# Patient Record
Sex: Male | Born: 1990 | Hispanic: No | Marital: Single | Smoking: Never smoker
Health system: Southern US, Community
[De-identification: ages and names within clinical notes are randomized; demographics above are authoritative.]

## PROBLEM LIST (undated history)

## (undated) HISTORY — PX: ANKLE SURGERY: SHX546

## (undated) HISTORY — PX: TONSILLECTOMY: SUR1361

---

## 2015-07-13 ENCOUNTER — Emergency Department
Admission: EM | Admit: 2015-07-13 | Discharge: 2015-07-13 | Disposition: A | Discharge status: Home / Self Care | Payer: Self-pay | Attending: Emergency Medicine | Admitting: Emergency Medicine

## 2015-07-13 ENCOUNTER — Encounter: Payer: Self-pay | Admitting: Emergency Medicine

## 2015-07-13 ENCOUNTER — Emergency Department: Payer: Self-pay

## 2015-07-13 DIAGNOSIS — R079 Chest pain, unspecified: Secondary | ICD-10-CM | POA: Insufficient documentation

## 2015-07-13 DIAGNOSIS — F419 Anxiety disorder, unspecified: Secondary | ICD-10-CM | POA: Insufficient documentation

## 2015-07-13 DIAGNOSIS — I10 Essential (primary) hypertension: Secondary | ICD-10-CM | POA: Insufficient documentation

## 2015-07-13 LAB — CBC
HCT: 43.1 % (ref 40.0–52.0)
HEMOGLOBIN: 14.7 g/dL (ref 13.0–18.0)
MCH: 26.9 pg (ref 26.0–34.0)
MCHC: 34.2 g/dL (ref 32.0–36.0)
MCV: 78.7 fL — AB (ref 80.0–100.0)
PLATELETS: 133 10*3/uL — AB (ref 150–440)
RBC: 5.47 MIL/uL (ref 4.40–5.90)
RDW: 12.3 % (ref 11.5–14.5)
WBC: 6.2 10*3/uL (ref 3.8–10.6)

## 2015-07-13 LAB — COMPREHENSIVE METABOLIC PANEL
ALK PHOS: 51 U/L (ref 38–126)
ALT: 15 U/L — AB (ref 17–63)
AST: 17 U/L (ref 15–41)
Albumin: 4.7 g/dL (ref 3.5–5.0)
Anion gap: 7 (ref 5–15)
BUN: 14 mg/dL (ref 6–20)
CALCIUM: 9.3 mg/dL (ref 8.9–10.3)
CHLORIDE: 104 mmol/L (ref 101–111)
CO2: 29 mmol/L (ref 22–32)
CREATININE: 1.05 mg/dL (ref 0.61–1.24)
GFR calc non Af Amer: >60 mL/min (ref >60)
GLUCOSE: 106 mg/dL — AB (ref 65–99)
Potassium: 4 mmol/L (ref 3.5–5.1)
SODIUM: 140 mmol/L (ref 135–145)
Total Bilirubin: 0.7 mg/dL (ref 0.3–1.2)
Total Protein: 7.3 g/dL (ref 6.5–8.1)

## 2015-07-13 LAB — TROPONIN I: Troponin I: <0.03 ng/mL (ref <0.031)

## 2015-07-13 NOTE — Discharge Instructions (Signed)
Hypertension °Hypertension, commonly called [place] blood pressure, is when the force of blood pumping through your arteries is too strong. Your arteries are the blood vessels that carry blood from your heart throughout your body. A blood pressure reading consists of a higher number over a lower number, such as 110/72. The higher number (systolic) is the pressure inside your arteries when your heart pumps. The lower number (diastolic) is the pressure inside your arteries when your heart relaxes. Ideally you want your blood pressure below 120/80. °Hypertension forces your heart to work harder to pump blood. Your arteries may become narrow or stiff. Having untreated or uncontrolled hypertension can cause heart attack, stroke, kidney disease, and other problems. °RISK FACTORS °Some risk factors for W[place] blood pressure are controllable. Others are not.  °Risk factors you cannot control include:  °· Race. You may be at higher risk if you are African American. °· Age. Risk increases with age. °· Gender. Men are at higher risk than women before age 45 years. After age 65, women are at higher risk than men. °Risk factors you can control include: °· Not getting enough exercise or physical activity. °· Being overweight. °· Getting too much fat, sugar, calories, or salt in your diet. °· Drinking too much alcohol. °SIGNS AND SYMPTOMS °Hypertension does not usually cause signs or symptoms. Extremely K[place] blood pressure (hypertensive crisis) may cause headache, anxiety, shortness of breath, and nosebleed. °DIAGNOSIS °To check if you have hypertension, your health care provider will measure your blood pressure while you are seated, with your arm held at the level of your heart. It should be measured at least twice using the same arm. Certain conditions can cause a difference in blood pressure between your right and left arms. A blood pressure reading that is higher than normal on one occasion does not mean that you need treatment. If  it is not clear whether you have Wei[place] blood pressure, you may be asked to return on a different day to have your blood pressure checked again. Or, you may be asked to monitor your blood pressure at home for 1 or more weeks. °TREATMENT °Treating [place] blood pressure includes making lifestyle changes and possibly taking medicine. Living a healthy lifestyle can help lower Garr blood pressure. You may need to change some of your habits. °Lifestyle changes may include: °· Following the DASH diet. This diet is [place] in fruits, vegetables, and whole grains. It is low in salt, red meat, and added sugars. °· Keep your sodium intake below 2,300 mg per day. °· Getting at least 30-45 minutes of aerobic exercise at least 4 times per week. °· Losing weight if necessary. °· Not smoking. °· Limiting alcoholic beverages. °· Learning ways to reduce stress. °Your health care provider may prescribe medicine if lifestyle changes are not enough to get your blood pressure under control, and if one of the following is true: °· You are 18-59 years of age and your systolic blood pressure is above 140. °· You are 60 years of age or older, and your systolic blood pressure is above 150. °· Your diastolic blood pressure is above 90. °· You have diabetes, and your systolic blood pressure is over 140 or your diastolic blood pressure is over 90. °· You have kidney disease and your blood pressure is above 140/90. °· You have heart disease and your blood pressure is above 140/90. °Your personal target blood pressure may vary depending on your medical conditions, your age, and other factors. °HOME CARE INSTRUCTIONS °·   Have your blood pressure rechecked as directed by your health care provider.   °· Take medicines only as directed by your health care provider. Follow the directions carefully. Blood pressure medicines must be taken as prescribed. The medicine does not work as well when you skip doses. Skipping doses also puts you at risk for  problems. °· Do not smoke.   °· Monitor your blood pressure at home as directed by your health care provider.  °SEEK MEDICAL CARE IF:  °· You think you are having a reaction to medicines taken. °· You have recurrent headaches or feel dizzy. °· You have swelling in your ankles. °· You have trouble with your vision. °SEEK IMMEDIATE MEDICAL CARE IF: °· You develop a severe headache or confusion. °· You have unusual weakness, numbness, or feel faint. °· You have severe chest or abdominal pain. °· You vomit repeatedly. °· You have trouble breathing. °MAKE SURE YOU:  °· Understand these instructions. °· Will watch your condition. °· Will get help right away if you are not doing well or get worse. °  °This information is not intended to replace advice given to you by your health care provider. Make sure you discuss any questions you have with your health care provider. °  °Document Released: 05/03/2005 Document Revised: 09/17/2014 Document Reviewed: 02/23/2013 °Elsevier Interactive Patient Education ©2016 [employer] ° °Nonspecific Chest Pain °It is often hard to find the cause of chest pain. There is always a chance that your pain could be related to something serious, such as a heart attack or a blood clot in your lungs. Chest pain can also be caused by conditions that are not life-threatening. If you have chest pain, it is very important to follow up with your doctor. ° °HOME CARE °· If you were prescribed an antibiotic medicine, finish it all even if you start to feel better. °· Avoid any activities that cause chest pain. °· Do not use any tobacco products, including cigarettes, chewing tobacco, or electronic cigarettes. If you need help quitting, ask your doctor. °· Do not drink alcohol. °· Take medicines only as told by your doctor. °· Keep all follow-up visits as told by your doctor. This is important. This includes any further testing if your chest pain does not go away. °· Your doctor may tell you to keep your  head raised (elevated) while you sleep. °· Make lifestyle changes as told by your doctor. These may include: °¨ Getting regular exercise. Ask your doctor to suggest some activities that are safe for you. °¨ Eating a heart-healthy diet. Your doctor or a diet specialist (dietitian) can help you to learn healthy eating options. °¨ Maintaining a healthy weight. °¨ Managing diabetes, if necessary. °¨ Reducing stress. °GET HELP IF: °· Your chest pain does not go away, even after treatment. °· You have a rash with blisters on your chest. °· You have a fever. °GET HELP RIGHT AWAY IF: °· Your chest pain is worse. °· You have an increasing cough, or you cough up blood. °· You have severe belly (abdominal) pain. °· You feel extremely weak. °· You pass out (faint). °· You have chills. °· You have sudden, unexplained chest discomfort. °· You have sudden, unexplained discomfort in your arms, back, neck, or jaw. °· You have shortness of breath at any time. °· You suddenly start to sweat, or your skin gets clammy. °· You feel nauseous. °· You vomit. °· You suddenly feel light-headed or dizzy. °· Your heart begins to beat quickly, or   it feels like it is skipping beats. °These symptoms may be an emergency. Do not wait to see if the symptoms will go away. Get medical help right away. Call your local emergency services (911 in the [place] Do not drive yourself to the hospital. °  °This information is not intended to replace advice given to you by your health care provider. Make sure you discuss any questions you have with your health care provider. °  °Document Released: 10/20/2007 Document Revised: 05/24/2014 Document Reviewed: 12/07/2013 °Elsevier Interactive Patient Education ©2016 [employer] ° °

## 2015-07-13 NOTE — ED Provider Notes (Signed)
[facility] Emergency Department Provider Note  ____________________________________________    I have reviewed the triage vital signs and the nursing notes.   HISTORY  Chief Complaint Dizziness  chest pain   HPI Vane Yapp is a 25 y.o. male 's primary complaint is intermittent chest pain over the last week 1 to 2 weeks. He reports it usually happens when he is resting and he feels a mild tightness in his chest. He came to the emergency department today because he felt dizzy although no chest pain this morning. He denies palpitations or arrhythmias. No history of heart disease in his family. Does not smoke. Denies drug use.No recent travel, no calf pain or swelling     History reviewed. No pertinent past medical history.  There are no active problems to display for this patient.   Past Surgical History  Procedure Laterality Date  . Ankle surgery Left   . Tonsillectomy      No current outpatient prescriptions on file.  Allergies Review of patient's allergies indicates no known allergies.  No family history on file.  Social History Social History  Substance Use Topics  . Smoking status: Never Smoker   . Smokeless tobacco: None  . Alcohol Use: No    Review of Systems  Constitutional: Negative for fever. Eyes: Negative for visual changes. ENT: Negative for sore throat Cardiovascular: As above Respiratory: Negative for shortness of breath. Mild cough Gastrointestinal: Negative for abdominal pain Genitourinary: Negative for dysuria. Musculoskeletal: Negative for back pain. Skin: Negative for rash. Neurological: Negative for headaches  Psychiatric: Mild anxiety    ____________________________________________   PHYSICAL EXAM:  VITAL SIGNS: ED Triage Vitals  Enc Vitals Group     BP 07/13/15 0946 156/79 mmHg     Pulse Rate 07/13/15 0946 91     Resp 07/13/15 0946 18     Temp 07/13/15 0946 98.4 F (36.9 C)     Temp Source  07/13/15 0946 Oral     SpO2 07/13/15 0946 97 %     Weight 07/13/15 0946 230 lb (104.327 kg)     Height 07/13/15 0946  (1.905 m)     Head Cir --      Peak Flow --      Pain Score 07/13/15 0952 5     Pain Loc --      Pain Edu? --      Excl. in GC? --      Constitutional: Alert and oriented. Well appearing and in no distress. Eyes: Conjunctivae are normal.  ENT   Head: Normocephalic and atraumatic.   Mouth/Throat: Mucous membranes are moist. Cardiovascular: Normal rate, regular rhythm. Normal and symmetric distal pulses are present in all extremities. No murmurs, rubs, or gallops. Respiratory: Normal respiratory effort without tachypnea nor retractions. Breath sounds are clear and equal bilaterally.  Gastrointestinal: Soft and non-tender in all quadrants. No distention. There is no CVA tenderness. Genitourinary: deferred Musculoskeletal: Nontender with normal range of motion in all extremities. No lower extremity tenderness nor edema. Neurologic:  Normal speech and language. No gross focal neurologic deficits are appreciated. Cranial nerves II through XII are normal. Normal gait Skin:  Skin is warm, dry and intact. No rash noted. Psychiatric: Mood and affect are normal. Patient exhibits appropriate insight and judgment.  ____________________________________________    LABS (pertinent positives/negatives)  Labs Reviewed  CBC  COMPREHENSIVE METABOLIC PANEL  TROPONIN I    ____________________________________________   EKG  ED ECG REPORT I, Jene Every, the attending physician,  personally viewed and interpreted this ECG.  Date: 07/13/2015 EKG Time: 10:01 AM Rate: 81 Rhythm: normal sinus rhythm QRS Axis: normal Intervals: normal ST/T Wave abnormalities: normal Conduction Disturbances: none Narrative Interpretation: unremarkable   ____________________________________________    RADIOLOGY I have personally reviewed any xrays that were ordered on this  patient: Chest x-ray unremarkable  ____________________________________________   PROCEDURES  Procedure(s) performed: none  Critical Care performed: none  ____________________________________________   INITIAL IMPRESSION / ASSESSMENT AND PLAN / ED COURSE  Pertinent labs & imaging results that were available during my care of the patient were reviewed by me and considered in my medical decision making (see chart for details).  She presented with intermittent chest discomfort is described as pressure-like typically at rest. No recent travel or calf pain or swelling. PERC negative. Not consistent with ACS and EKG is unremarkable. We waiting on labs and chest x-ray.  Discussed hypertension with patient he is new to the area and does not have a PCP yet, I will refer him to [facility] clinic internal medicine.  Labs EKG normal. Patient well-appearing and has no chest pain. Reiterated the need for outpatient follow-up for [place] blood pressure. Return precautions discussed  ____________________________________________   FINAL CLINICAL IMPRESSION(S) / ED DIAGNOSES  Final diagnoses:  Chest pain, unspecified chest pain type  Essential hypertension     Jene Every, MD 07/13/15 1200

## 2015-07-13 NOTE — ED Notes (Addendum)
Pt reports falling 2 weeks ago and hit his head. States he woke up with nausea and headache. Pt also c/o chest pain that comes and goes for past month and heart palpitations when lying down.. Pt has multiple complaints but states he has felt lightheaded and dizziness and that is what brought him in today.

## 2015-07-13 NOTE — ED Notes (Signed)
Pt transported to xray 

## 2016-09-29 IMAGING — CR DG CHEST 2V
1 series · 2 of 2 positions shown · non-contrast
Comparison: None.

CLINICAL DATA: Anterior chest pain since this morning.

EXAM:
CHEST  2 VIEW

[Series 1: w chest pa · 0.14mm/px · 2 of 2 slices shown]
[im 1/2]
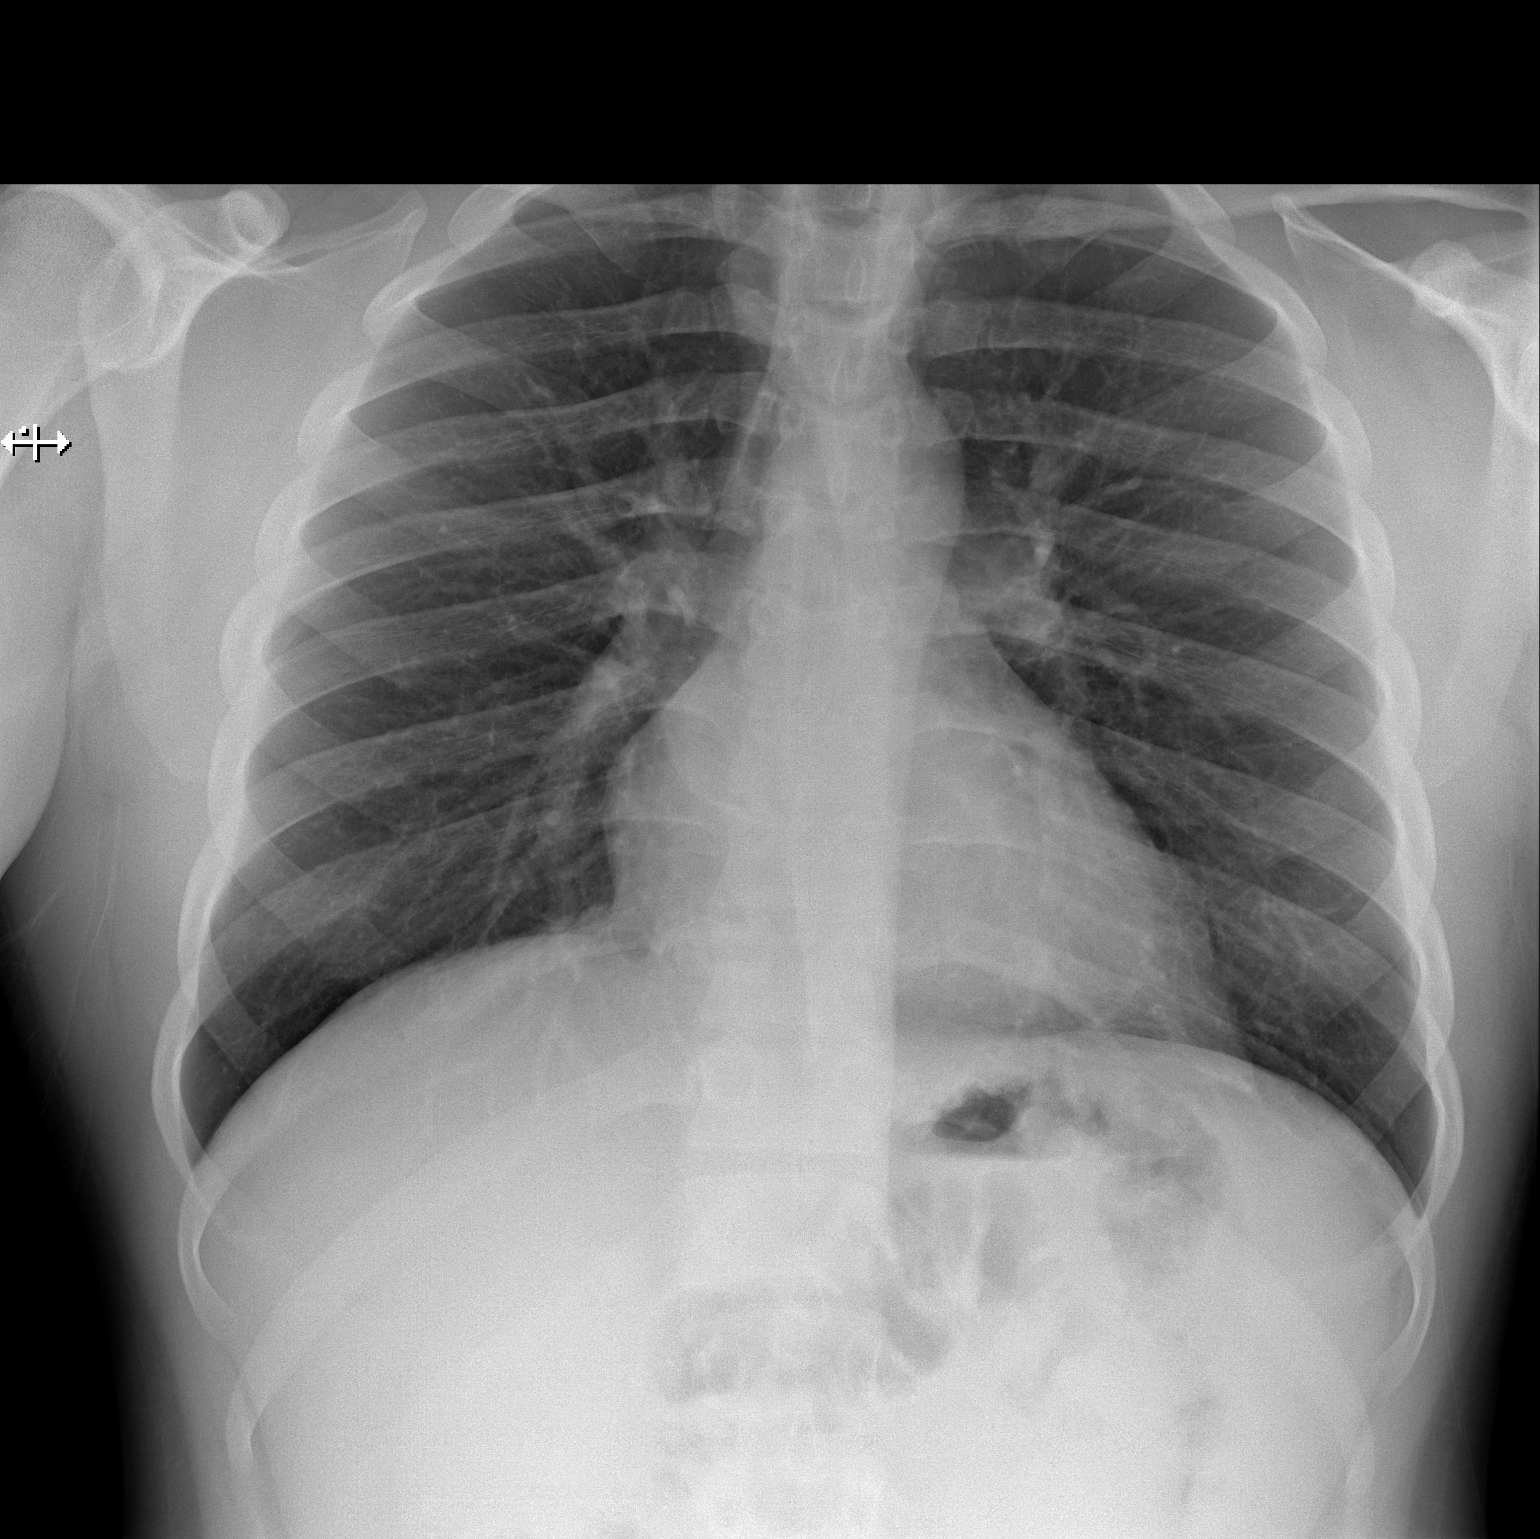
[im 2/2]
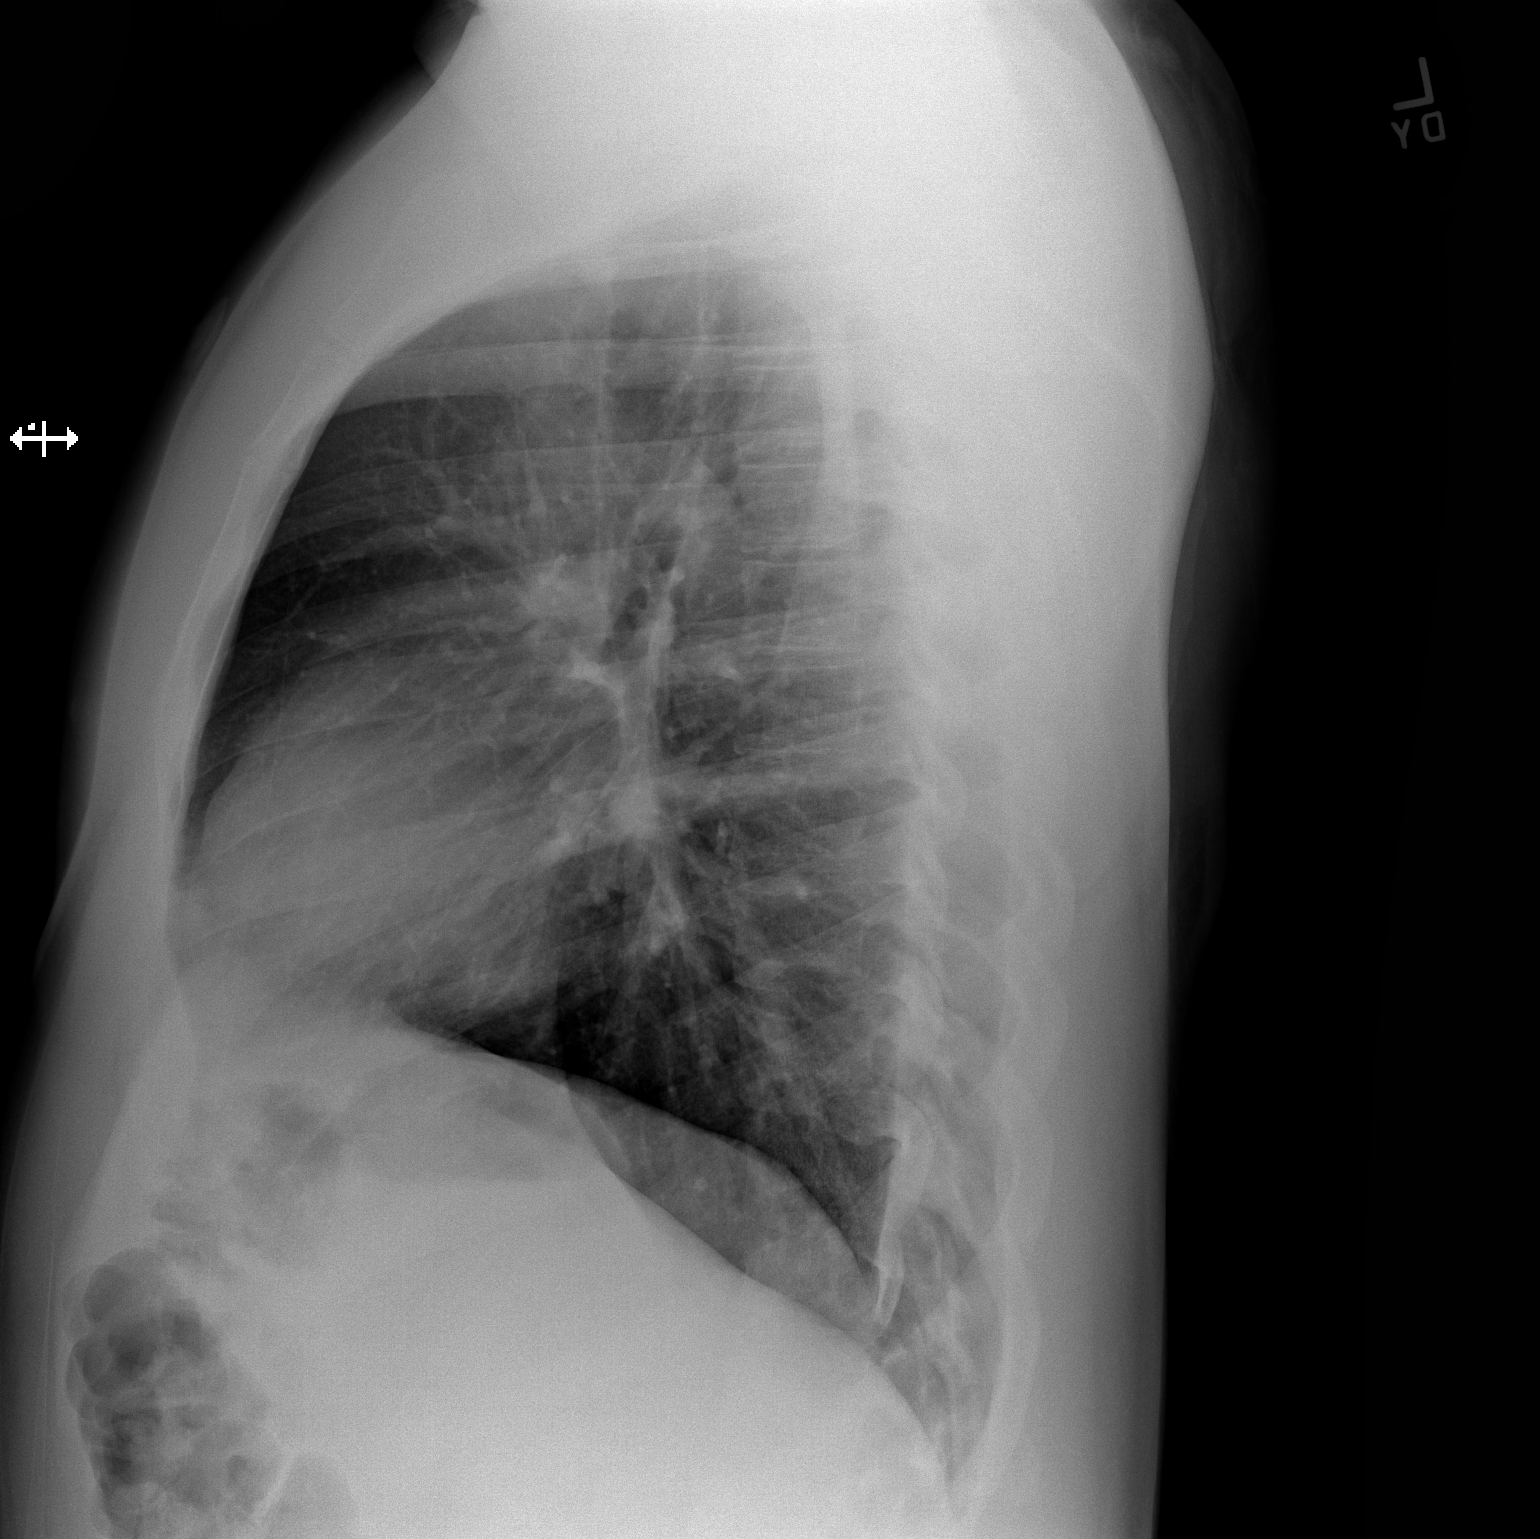

[2 of 2 positions shown; findings below may reference images not displayed]

FINDINGS: The heart size and mediastinal contours are within normal limits.
Both lungs are clear. The visualized skeletal structures are
unremarkable.
IMPRESSION: No active cardiopulmonary disease.
